# Patient Record
Sex: Male | Born: 1960 | Race: White | Hispanic: No
Health system: Southern US, Community
[De-identification: ages and names within clinical notes are randomized; demographics above are authoritative.]

---

## 2004-03-14 ENCOUNTER — Inpatient Hospital Stay (HOSPITAL_COMMUNITY): Admission: EM | Admit: 2004-03-14 | Discharge: 2004-03-14 | Payer: Self-pay | Admitting: Emergency Medicine

## 2006-02-08 IMAGING — NM NM MYOCAR MULTI W/ SPECT
1 series · 6 of 6 positions shown · non-contrast
Comparison: none

CLINICAL DATA: Chest pain. 
 MYOCARDIAL PERFUSION STUDY ? 03/14/04
 The patient underwent a single day treadmill stress study with 10 mCi of Technetium 99m Sestamibi injected IV prior to rest images and 30 mCi prior to stress images.   
 Decreased activity is noted inferiorly, improving with stress images, most compatible with diaphragmatic attenuation.   This area moves and thickens normally.   
 No fixed or reversible defects seen to suggest ischemia or infarct. 
 EJECTION FRACTION
 The ejection fraction is 54 percent with an end diastolic volume of 161 ml and end systolic volume of 74 ml.
 WALL MOTION ANALYSIS
 There is normal wall motion and wall thickening in all regions. 
 IMPRESSION 
 Probable diaphragmatic attenuation inferiorly.   
 No ischemia or infarct. 
 Ejection fraction is 54 percent.

[Series 1: cr cardiolite low dose · 6.79mm/px · 6 of 64 frames shown]
[frame 6/64]
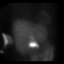
[frame 16/64]
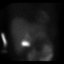
[frame 27/64]
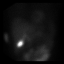
[frame 38/64]
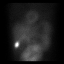
[frame 48/64]
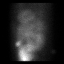
[frame 59/64]
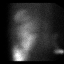

[6 of 6 positions shown; findings below may reference images not displayed]

## 2006-02-08 IMAGING — CR DG CHEST 1V PORT
1 series · 1 of 1 positions shown · non-contrast
Comparison: None.

CLINICAL DATA: Chest pain.  
 PORTABLE CHEST 03/14/04 AT 1811 HOURS

[view not recorded]
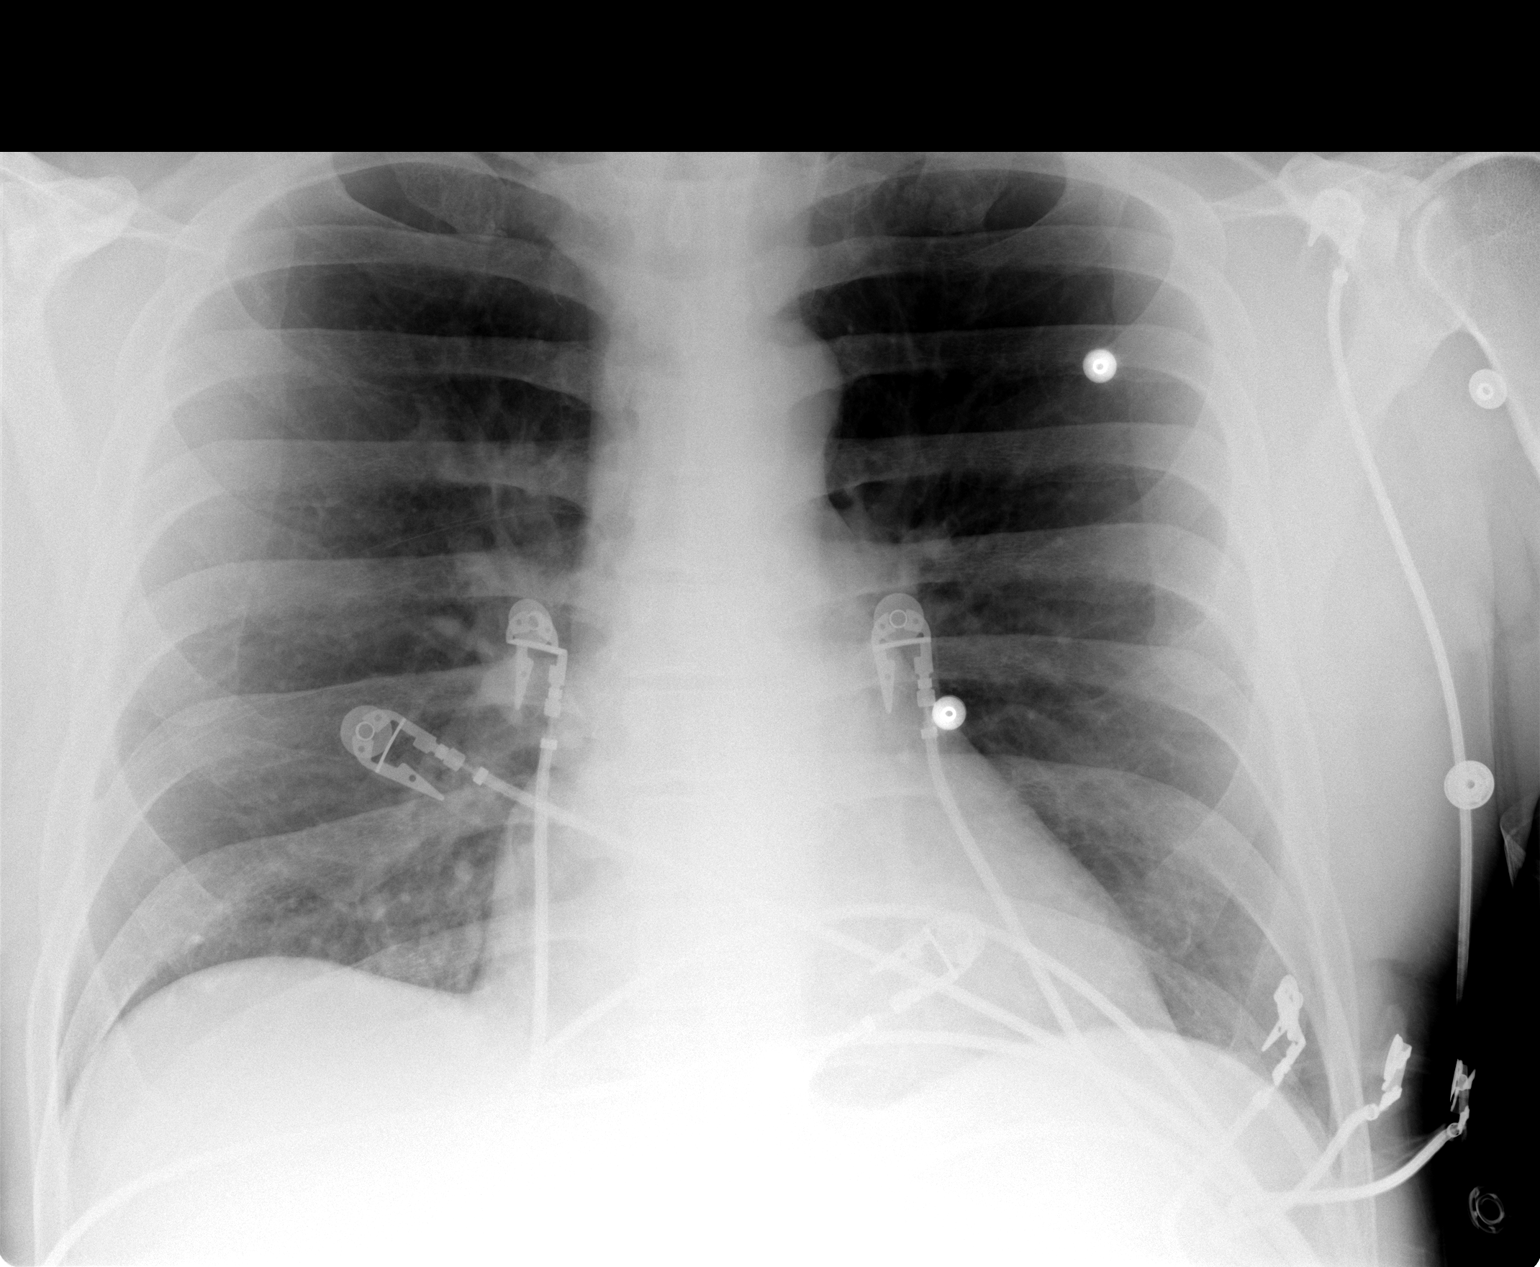

[1 of 1 positions shown; findings below may reference images not displayed]

The heart size and mediastinal contours are within normal limits. The lungs are clear.

 IMPRESSION
 No acute disease.

## 2014-03-27 ENCOUNTER — Telehealth: Payer: Self-pay | Admitting: Family Medicine

## 2014-03-27 NOTE — Telephone Encounter (Signed)
DT 

## 2015-12-26 DEATH — deceased
